# Patient Record
Sex: Female | Born: 1995 | Race: Black or African American | Hispanic: No | Marital: Single | Smoking: Never smoker
Health system: Southern US, Community
[De-identification: ages and names within clinical notes are randomized; demographics above are authoritative.]

## PROBLEM LIST (undated history)

## (undated) DIAGNOSIS — L732 Hidradenitis suppurativa: Secondary | ICD-10-CM

## (undated) HISTORY — PX: WISDOM TOOTH EXTRACTION: SHX21

## (undated) HISTORY — PX: OTHER SURGICAL HISTORY: SHX169

---
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
## 2019-02-19 DIAGNOSIS — L0591 Pilonidal cyst without abscess: Secondary | ICD-10-CM | POA: Insufficient documentation

## 2019-11-19 ENCOUNTER — Other Ambulatory Visit: Payer: Self-pay

## 2019-11-19 DIAGNOSIS — Z20822 Contact with and (suspected) exposure to covid-19: Secondary | ICD-10-CM

## 2019-11-20 LAB — NOVEL CORONAVIRUS, NAA: SARS-CoV-2, NAA: NOT DETECTED

## 2020-03-27 ENCOUNTER — Ambulatory Visit (INDEPENDENT_AMBULATORY_CARE_PROVIDER_SITE_OTHER): Payer: Medicaid Other

## 2020-03-27 ENCOUNTER — Ambulatory Visit (HOSPITAL_COMMUNITY)
Admission: EM | Admit: 2020-03-27 | Discharge: 2020-03-27 | Disposition: A | Discharge status: Home / Self Care | Payer: Medicaid Other | Attending: Family Medicine | Admitting: Family Medicine

## 2020-03-27 ENCOUNTER — Encounter (HOSPITAL_COMMUNITY): Payer: Self-pay

## 2020-03-27 ENCOUNTER — Other Ambulatory Visit: Payer: Self-pay

## 2020-03-27 DIAGNOSIS — M545 Low back pain, unspecified: Secondary | ICD-10-CM

## 2020-03-27 DIAGNOSIS — R52 Pain, unspecified: Secondary | ICD-10-CM | POA: Diagnosis not present

## 2020-03-27 DIAGNOSIS — M25551 Pain in right hip: Secondary | ICD-10-CM

## 2020-03-27 DIAGNOSIS — M542 Cervicalgia: Secondary | ICD-10-CM

## 2020-03-27 MED ORDER — TIZANIDINE HCL 4 MG PO TABS: 4.0000 mg | ORAL_TABLET | Freq: Four times a day (QID) | ORAL | 0 refills | Status: DC | PRN

## 2020-03-27 MED ORDER — NAPROXEN 500 MG PO TABS: 500.0000 mg | ORAL_TABLET | Freq: Two times a day (BID) | ORAL | 0 refills | Status: AC
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
## 2020-03-27 NOTE — Discharge Instructions (Signed)
Take the naproxen 2 x a day with food Take the tizanidine as needed This is helpful at night Activity as tolerated Ice or heat to area See sport medicine if pain persists
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
## 2020-04-11 ENCOUNTER — Other Ambulatory Visit: Payer: Self-pay

## 2020-04-11 ENCOUNTER — Ambulatory Visit (INDEPENDENT_AMBULATORY_CARE_PROVIDER_SITE_OTHER): Payer: Medicaid Other

## 2020-04-11 ENCOUNTER — Encounter (HOSPITAL_COMMUNITY): Payer: Self-pay

## 2020-04-11 ENCOUNTER — Ambulatory Visit (HOSPITAL_COMMUNITY)
Admission: EM | Admit: 2020-04-11 | Discharge: 2020-04-11 | Disposition: A | Discharge status: Home / Self Care | Payer: Medicaid Other | Attending: Emergency Medicine | Admitting: Emergency Medicine

## 2020-04-11 DIAGNOSIS — R1010 Upper abdominal pain, unspecified: Secondary | ICD-10-CM

## 2020-04-11 LAB — COMPREHENSIVE METABOLIC PANEL
ALT: 36 U/L (ref 0–44)
AST: 33 U/L (ref 15–41)
Albumin: 3.9 g/dL (ref 3.5–5.0)
Alkaline Phosphatase: 49 U/L (ref 38–126)
Anion gap: 9 (ref 5–15)
BUN: <5 mg/dL — ABNORMAL LOW (ref 6–20)
CO2: 26 mmol/L (ref 22–32)
Calcium: 9.5 mg/dL (ref 8.9–10.3)
Chloride: 103 mmol/L (ref 98–111)
Creatinine, Ser: 0.72 mg/dL (ref 0.44–1.00)
GFR calc Af Amer: >60 mL/min (ref >60)
GFR calc non Af Amer: >60 mL/min (ref >60)
Glucose, Bld: 84 mg/dL (ref 70–99)
Potassium: 3.5 mmol/L (ref 3.5–5.1)
Sodium: 138 mmol/L (ref 135–145)
Total Bilirubin: 0.5 mg/dL (ref 0.3–1.2)
Total Protein: 7.8 g/dL (ref 6.5–8.1)

## 2020-04-11 LAB — LIPASE, BLOOD: Lipase: 22 U/L (ref 11–51)

## 2020-04-11 LAB — CBC
HCT: 45.2 % (ref 36.0–46.0)
Hemoglobin: 14.5 g/dL (ref 12.0–15.0)
MCH: 27.9 pg (ref 26.0–34.0)
MCHC: 32.1 g/dL (ref 30.0–36.0)
MCV: 86.9 fL (ref 80.0–100.0)
Platelets: 372 10*3/uL (ref 150–400)
RBC: 5.2 MIL/uL — ABNORMAL HIGH (ref 3.87–5.11)
RDW: 13 % (ref 11.5–15.5)
WBC: 6 10*3/uL (ref 4.0–10.5)
nRBC: 0 % (ref 0.0–0.2)

## 2020-04-11 MED ORDER — LIDOCAINE VISCOUS HCL 2 % MT SOLN
15.0000 mL | Freq: Once | OROMUCOSAL | Status: AC
  Administered 2020-04-11: 15 mL via ORAL

## 2020-04-11 MED ORDER — ALUM & MAG HYDROXIDE-SIMETH 400-400-40 MG/5ML PO SUSP: 15.0000 mL | Freq: Four times a day (QID) | ORAL | 0 refills | Status: DC | PRN

## 2020-04-11 MED ORDER — LIDOCAINE VISCOUS HCL 2 % MT SOLN
OROMUCOSAL | Status: AC
  Filled 2020-04-11: qty 15

## 2020-04-11 MED ORDER — ONDANSETRON 4 MG PO TBDP: 4.0000 mg | ORAL_TABLET | Freq: Three times a day (TID) | ORAL | 0 refills | Status: DC | PRN

## 2020-04-11 MED ORDER — OMEPRAZOLE 20 MG PO CPDR: 20.0000 mg | DELAYED_RELEASE_CAPSULE | Freq: Two times a day (BID) | ORAL | 0 refills | Status: DC

## 2020-04-11 MED ORDER — ALUM & MAG HYDROXIDE-SIMETH 200-200-20 MG/5ML PO SUSP
ORAL | Status: AC
  Filled 2020-04-11: qty 30

## 2020-04-11 MED ORDER — ALUM & MAG HYDROXIDE-SIMETH 200-200-20 MG/5ML PO SUSP
30.0000 mL | Freq: Once | ORAL | Status: AC
  Administered 2020-04-11: 30 mL via ORAL

## 2020-04-11 NOTE — Discharge Instructions (Signed)
Begin omeprazole/prilosec twice daily for the next 2 weeks for acid prevention, be sure to take consistently May supplement with maalox or pepcid/famotidine as needed for symptoms as omeprazole begins to work Zofran as needed for nausea Avoid trigger foods- see attached I will call if blood work abnormal  If pain worsening or changing please follow up

## 2020-04-11 NOTE — ED Triage Notes (Signed)
Pt presents with RUQ and LUQ abdominal pain X 1 week with some nausea, vomiting, and mild diarrhea.

## 2020-04-11 NOTE — ED Provider Notes (Signed)
[facility]    CSN: [id number] Arrival date & time: 04/11/20  1303      History   Chief Complaint Chief Complaint  Patient presents with  . Abdominal Pain    HPI Claire Horton is a 24 y.o. female no significant past medical history presenting today for evaluation of abdominal pain.  Patient notes that she has had upper abdominal pain for approximately 1 week.  Initially related this to menstrual cramping that she was on her menstrual cycle earlier, but this is stopped and her pain has continued.  Initially she had looser stools and diarrhea which have resolved.  She took an over-the-counter antidiarrheal and since has not had any bowel movements.  She also reports decreased appetite and poor oral intake.  She has had this sensation as if she needs to have a bowel movement, but has not passed her bowels.  She has also developed increased nausea and vomiting over the past few days as well.  Denies fevers.  Describes pain as sharp.  Denies burning sensation.  Denies chest pain or shortness of breath.  Denies sore throat or sour taste in mouth.  Denies any pelvic symptoms of abnormal discharge or pelvic pain.  Denies any dysuria, increased frequency or urgency with urination.  Is on oral contraceptives.  Denies prior abdominal surgeries.  HPI  History reviewed. No pertinent past medical history.  There are no problems to display for this patient.   Past Surgical History:  Procedure Laterality Date  . Cyst removed from wrist Left   . WISDOM TOOTH EXTRACTION      OB History   No obstetric history on file.      Home Medications    Prior to Admission medications   Medication Sig Start Date End Date Taking? Authorizing Provider  ALAYCEN 1/35 tablet Take 1 tablet by mouth daily. 02/15/20   [provider]  alum & mag hydroxide-simeth (MAALOX PLUS) 400-400-40 MG/5ML suspension Take 15 mLs by mouth every 6 (six) hours as needed for indigestion. 04/11/20   Travon Crochet,  Adaley Kiene C, PA-C  naproxen (NAPROSYN) 500 MG tablet Take 1 tablet (500 mg total) by mouth 2 (two) times daily. 03/27/20   Eustace Moore, MD  omeprazole (PRILOSEC) 20 MG capsule Take 1 capsule (20 mg total) by mouth 2 (two) times daily before a meal for 14 days. 04/11/20 04/25/20  Kameran Mcneese C, PA-C  ondansetron (ZOFRAN ODT) 4 MG disintegrating tablet Take 1 tablet (4 mg total) by mouth every 8 (eight) hours as needed for nausea or vomiting. 04/11/20   Dallyn Bergland C, PA-C  tiZANidine (ZANAFLEX) 4 MG tablet Take 1-2 tablets (4-8 mg total) by mouth every 6 (six) hours as needed for muscle spasms. 03/27/20   Eustace Moore, MD    Family History Family History  Problem Relation Age of Onset  . Hypertension Mother     Social History Social History   Tobacco Use  . Smoking status: Never Smoker  . Smokeless tobacco: Never Used  Substance Use Topics  . Alcohol use: Yes    Alcohol/week: 3.0 standard drinks    Types: 3 Shots of liquor per week  . Drug use: Never     Allergies   Patient has no known allergies.   Review of Systems Review of Systems  Constitutional: Negative for fever.  Respiratory: Negative for shortness of breath.   Cardiovascular: Negative for chest pain.  Gastrointestinal: Positive for abdominal pain, constipation, nausea and vomiting. Negative for diarrhea.  Genitourinary: Negative for dysuria, flank pain, genital sores, hematuria, menstrual problem, vaginal bleeding, vaginal discharge and vaginal pain.  Musculoskeletal: Negative for back pain.  Skin: Negative for rash.  Neurological: Negative for dizziness, light-headedness and headaches.     Physical Exam Triage Vital Signs ED Triage Vitals  Enc Vitals Group     BP 04/11/20 1422 137/79     Pulse Rate 04/11/20 1422 81     Resp 04/11/20 1422 18     Temp 04/11/20 1422 99.2 F (37.3 C)     Temp Source 04/11/20 1422 Oral     SpO2 04/11/20 1422 100 %     Weight --      Height --      Head  Circumference --      Peak Flow --      Pain Score 04/11/20 1423 7     Pain Loc --      Pain Edu? --      Excl. in [place]? --    No data found.  Updated Vital Signs BP 137/79 (BP Location: Left Arm)   Pulse 81   Temp 99.2 F (37.3 C) (Oral)   Resp 18   LMP 04/06/2020   SpO2 100%   Visual Acuity Right Eye Distance:   Left Eye Distance:   Bilateral Distance:    Right Eye Near:   Left Eye Near:    Bilateral Near:     Physical Exam Vitals and nursing note reviewed.  Constitutional:      General: She is not in acute distress.    Appearance: She is well-developed.  HENT:     Head: Normocephalic and atraumatic.     Mouth/Throat:     Comments: Oral mucosa pink and moist, no tonsillar enlargement or exudate. Posterior pharynx patent and nonerythematous, no uvula deviation or swelling. Normal phonation. Eyes:     Conjunctiva/sclera: Conjunctivae normal.  Cardiovascular:     Rate and Rhythm: Normal rate and regular rhythm.     Heart sounds: No murmur.  Pulmonary:     Effort: Pulmonary effort is normal. No respiratory distress.     Breath sounds: Normal breath sounds.     Comments: Breathing comfortably at rest, CTABL, no wheezing, rales or other adventitious sounds auscultated  Abdominal:     Palpations: Abdomen is soft.     Tenderness: There is abdominal tenderness.     Comments: Soft, nondistended, tender to palpation throughout upper abdomen and epigastrium, nontender to bilateral lower quadrants or suprapubic area, negative Murphy's, negative McBurney's, negative Rovsing  Musculoskeletal:     Cervical back: Neck supple.  Skin:    General: Skin is warm and dry.  Neurological:     Mental Status: She is alert.      UC Treatments / Results  Labs (all labs ordered are listed, but only abnormal results are displayed) Labs Reviewed  CBC  COMPREHENSIVE METABOLIC PANEL  LIPASE, BLOOD    EKG   Radiology DG Abd 2 Views  Result Date: 04/11/2020 CLINICAL DATA:   Acute upper abdominal pain. EXAM: ABDOMEN - 2 VIEW COMPARISON:  None. FINDINGS: The bowel gas pattern is normal. Mild amount of stool is seen in the descending colon. There is no evidence of free air. No radio-opaque calculi or other significant radiographic abnormality is seen. IMPRESSION: No evidence of bowel obstruction or ileus.  Mild stool burden. Electronically Signed   By: Marijo Conception M.D.   On: 04/11/2020 14:47    Procedures Procedures (including critical  care time)  Medications Ordered in UC Medications  alum & mag hydroxide-simeth (MAALOX/MYLANTA) 200-200-20 MG/5ML suspension 30 mL (30 mLs Oral Given 04/11/20 1523)    And  lidocaine (XYLOCAINE) 2 % viscous mouth solution 15 mL (15 mLs Oral Given 04/11/20 1523)    Initial Impression / Assessment and Plan / UC Course  I have reviewed the triage vital signs and the nursing notes.  Pertinent labs & imaging results that were available during my care of the patient were reviewed by me and considered in my medical decision making (see chart for details).     Obtained abdominal x-ray as initially symptoms suggestive of constipation, with vomiting monitor rule out obstruction.  No obstruction and mild stool burden within the colon.  Not highly suggestive of constipation as cause of pain.  Providing GI cocktail to see if this provides any relief of discomfort.  Checking CBC, CMP and lipase as well as other screening given symptoms for greater than 1 week.  Do not suspect viral/infectious etiology at this time.  Significant improvement in discomfort with GI cocktail.  Will initiate on omeprazole twice daily x2 weeks.  Supplement with Maalox or Pepcid as needed.  Zofran for nausea.  Avoid trigger foods.  Discussed strict return precautions. Patient verbalized understanding and is agreeable with plan.    Final Clinical Impressions(s) / UC Diagnoses   Final diagnoses:  Pain of upper abdomen     Discharge Instructions     Begin  omeprazole/prilosec twice daily for the next 2 weeks for acid prevention, be sure to take consistently May supplement with maalox or pepcid/famotidine as needed for symptoms as omeprazole begins to work Zofran as needed for nausea Avoid trigger foods- see attached I will call if blood work abnormal  If pain worsening or changing please follow up     ED Prescriptions    Medication Sig Dispense Auth. Provider   omeprazole (PRILOSEC) 20 MG capsule Take 1 capsule (20 mg total) by mouth 2 (two) times daily before a meal for 14 days. 28 capsule Rico Massar C, PA-C   alum & mag hydroxide-simeth (MAALOX PLUS) 400-400-40 MG/5ML suspension Take 15 mLs by mouth every 6 (six) hours as needed for indigestion. 355 mL Sayid Moll C, PA-C   ondansetron (ZOFRAN ODT) 4 MG disintegrating tablet Take 1 tablet (4 mg total) by mouth every 8 (eight) hours as needed for nausea or vomiting. 20 tablet Alieah Brinton, [place] C, PA-C     PDMP not reviewed this encounter.   Lew Dawes, [place] 04/11/20 1549

## 2020-08-21 DIAGNOSIS — K5190 Ulcerative colitis, unspecified, without complications: Secondary | ICD-10-CM | POA: Insufficient documentation

## 2020-09-30 DIAGNOSIS — K5100 Ulcerative (chronic) pancolitis without complications: Secondary | ICD-10-CM | POA: Insufficient documentation

## 2021-01-02 ENCOUNTER — Other Ambulatory Visit: Payer: Self-pay

## 2021-01-02 ENCOUNTER — Ambulatory Visit
Admission: EM | Admit: 2021-01-02 | Discharge: 2021-01-02 | Disposition: A | Discharge status: Home / Self Care | Payer: Medicaid Other | Attending: Emergency Medicine | Admitting: Emergency Medicine

## 2021-01-02 ENCOUNTER — Encounter: Payer: Self-pay | Admitting: Emergency Medicine

## 2021-01-02 DIAGNOSIS — L02411 Cutaneous abscess of right axilla: Secondary | ICD-10-CM

## 2021-01-02 MED ORDER — IBUPROFEN 800 MG PO TABS: 800.0000 mg | ORAL_TABLET | Freq: Three times a day (TID) | ORAL | 0 refills | Status: DC

## 2021-01-02 NOTE — Discharge Instructions (Addendum)
Complete course of doxycycline  Pull packing out in 24-48 hours  Apply warm compresses/hot rags to area with massage to express further drainage especially the first 24-48 hours  Return if symptoms returning or not improving

## 2021-01-02 NOTE — ED Triage Notes (Signed)
Pt here for abscess to right axillary area x 10 days; pt sts currently taking doxycycline but still having pain and swelling

## 2021-01-02 NOTE — ED Provider Notes (Signed)
[facility]    CSN: [id number] Arrival date & time: 01/02/21  0807      History   Chief Complaint Chief Complaint  Patient presents with  . Abscess    HPI Claire Horton is a 25 y.o. female presenting today for evaluation of right axilla abscess.  Area has been present for approximately 10 days.  On doxycycline but still having pain and swelling.  Reports recurrent history of abscesses.  HPI  History reviewed. No pertinent past medical history.  There are no problems to display for this patient.   Past Surgical History:  Procedure Laterality Date  . Cyst removed from wrist Left   . WISDOM TOOTH EXTRACTION      OB History   No obstetric history on file.      Home Medications    Prior to Admission medications   Medication Sig Start Date End Date Taking? Authorizing Provider  ibuprofen (ADVIL) 800 MG tablet Take 1 tablet (800 mg total) by mouth 3 (three) times daily. 01/02/21  Yes Berklie Dethlefs, Junius Creamer, PA-C  ALAYCEN 1/35 tablet Take 1 tablet by mouth daily. 02/15/20   [provider]  alum & mag hydroxide-simeth (MAALOX PLUS) 400-400-40 MG/5ML suspension Take 15 mLs by mouth every 6 (six) hours as needed for indigestion. 04/11/20   Cregg Jutte C, PA-C  naproxen (NAPROSYN) 500 MG tablet Take 1 tablet (500 mg total) by mouth 2 (two) times daily. 03/27/20   Eustace Moore, MD  omeprazole (PRILOSEC) 20 MG capsule Take 1 capsule (20 mg total) by mouth 2 (two) times daily before a meal for 14 days. 04/11/20 04/25/20  Atlee Villers C, PA-C  ondansetron (ZOFRAN ODT) 4 MG disintegrating tablet Take 1 tablet (4 mg total) by mouth every 8 (eight) hours as needed for nausea or vomiting. Patient not taking: Reported on 01/02/2021 04/11/20   Alexios Keown C, PA-C  tiZANidine (ZANAFLEX) 4 MG tablet Take 1-2 tablets (4-8 mg total) by mouth every 6 (six) hours as needed for muscle spasms. Patient not taking: Reported on 01/02/2021 03/27/20   Eustace Moore, MD     Family History Family History  Problem Relation Age of Onset  . Hypertension Mother     Social History Social History   Tobacco Use  . Smoking status: Never Smoker  . Smokeless tobacco: Never Used  Vaping Use  . Vaping Use: Never used  Substance Use Topics  . Alcohol use: Yes    Alcohol/week: 3.0 standard drinks    Types: 3 Shots of liquor per week  . Drug use: Never     Allergies   Patient has no known allergies.   Review of Systems Review of Systems  Constitutional: Negative for fatigue and fever.  HENT: Negative for mouth sores.   Eyes: Negative for visual disturbance.  Respiratory: Negative for shortness of breath.   Cardiovascular: Negative for chest pain.  Gastrointestinal: Negative for abdominal pain, nausea and vomiting.  Musculoskeletal: Negative for arthralgias and joint swelling.  Skin: Positive for color change and rash. Negative for wound.  Neurological: Negative for dizziness, weakness, light-headedness and headaches.     Physical Exam Triage Vital Signs ED Triage Vitals  Enc Vitals Group     BP 01/02/21 0819 (!) 155/84     Pulse Rate 01/02/21 0819 85     Resp 01/02/21 0819 18     Temp 01/02/21 0819 98.8 F (37.1 C)     Temp Source 01/02/21 0819 Oral     SpO2  01/02/21 0819 99 %     Weight --      Height --      Head Circumference --      Peak Flow --      Pain Score 01/02/21 0822 7     Pain Loc --      Pain Edu? --      Excl. in [place]? --    No data found.  Updated Vital Signs BP (!) 155/84 (BP Location: Left Arm)   Pulse 85   Temp 98.8 F (37.1 C) (Oral)   Resp 18   SpO2 99%   Visual Acuity Right Eye Distance:   Left Eye Distance:   Bilateral Distance:    Right Eye Near:   Left Eye Near:    Bilateral Near:     Physical Exam Vitals and nursing note reviewed.  Constitutional:      Appearance: She is well-developed and well-nourished.     Comments: No acute distress  HENT:     Head: Normocephalic and atraumatic.      Nose: Nose normal.  Eyes:     Conjunctiva/sclera: Conjunctivae normal.  Cardiovascular:     Rate and Rhythm: Normal rate.  Pulmonary:     Effort: Pulmonary effort is normal. No respiratory distress.  Abdominal:     General: There is no distension.  Musculoskeletal:        General: Normal range of motion.     Cervical back: Neck supple.  Skin:    General: Skin is warm and dry.     Comments: Right axilla with large 3 to 4 cm abscess with central area of fluctuance  Neurological:     Mental Status: She is alert and oriented to person, place, and time.  Psychiatric:        Mood and Affect: Mood and affect normal.      UC Treatments / Results  Labs (all labs ordered are listed, but only abnormal results are displayed) Labs Reviewed - No data to display  EKG   Radiology No results found.  Procedures Incision and Drainage  Date/Time: 01/02/2021 9:14 AM Performed by: Kristy Catoe, [place] C, PA-C Authorized by: Dimitrius Steedman, Junius Creamer, PA-C   Consent:    Consent obtained:  Verbal   Consent given by:  Patient   Risks discussed:  Bleeding, incomplete drainage and pain   Alternatives discussed:  No treatment and alternative treatment Universal protocol:    Patient identity confirmed:  Verbally with patient Location:    Type:  Abscess   Size:  4 cm   Location: left axilla. Pre-procedure details:    Skin preparation:  Povidone-iodine Anesthesia:    Anesthesia method:  Local infiltration   Local anesthetic:  Lidocaine 2% w/o epi Procedure type:    Complexity:  Simple Procedure details:    Incision types:  Single straight   Incision depth:  Subcutaneous   Wound management:  Probed and deloculated   Drainage:  Bloody and purulent   Drainage amount:  Copious   Wound treatment:  Wound left open   Packing materials:  1/4 in iodoform gauze Post-procedure details:    Procedure completion:  Tolerated well, no immediate complications   (including critical care time)  Medications  Ordered in UC Medications - No data to display  Initial Impression / Assessment and Plan / UC Course  I have reviewed the triage vital signs and the nursing notes.  Pertinent labs & imaging results that were available during my care of the patient  were reviewed by me and considered in my medical decision making (see chart for details).     I&D performed today, packing placed, packing to be removed in 24 to 48 hours.  Complete course of doxycycline, anti-inflammatories for pain.  Follow-up for concerns about healing.  Discussed strict return precautions. Patient verbalized understanding and is agreeable with plan.  Final Clinical Impressions(s) / UC Diagnoses   Final diagnoses:  Abscess of right axilla     Discharge Instructions     Complete course of doxycycline  Pull packing out in 24-48 hours  Apply warm compresses/hot rags to area with massage to express further drainage especially the first 24-48 hours  Return if symptoms returning or not improving    ED Prescriptions    Medication Sig Dispense Auth. Provider   ibuprofen (ADVIL) 800 MG tablet Take 1 tablet (800 mg total) by mouth 3 (three) times daily. 21 tablet Jowell Bossi, [place] C, PA-C     PDMP not reviewed this encounter.   Lew Dawes, [place] 01/02/21 [phone]

## 2021-05-14 IMAGING — DX DG ABDOMEN 2V
3 series · 3 of 3 positions shown · non-contrast
Comparison: None.

CLINICAL DATA: Acute upper abdominal pain.

EXAM:
ABDOMEN - 2 VIEW

[abdomen erect]
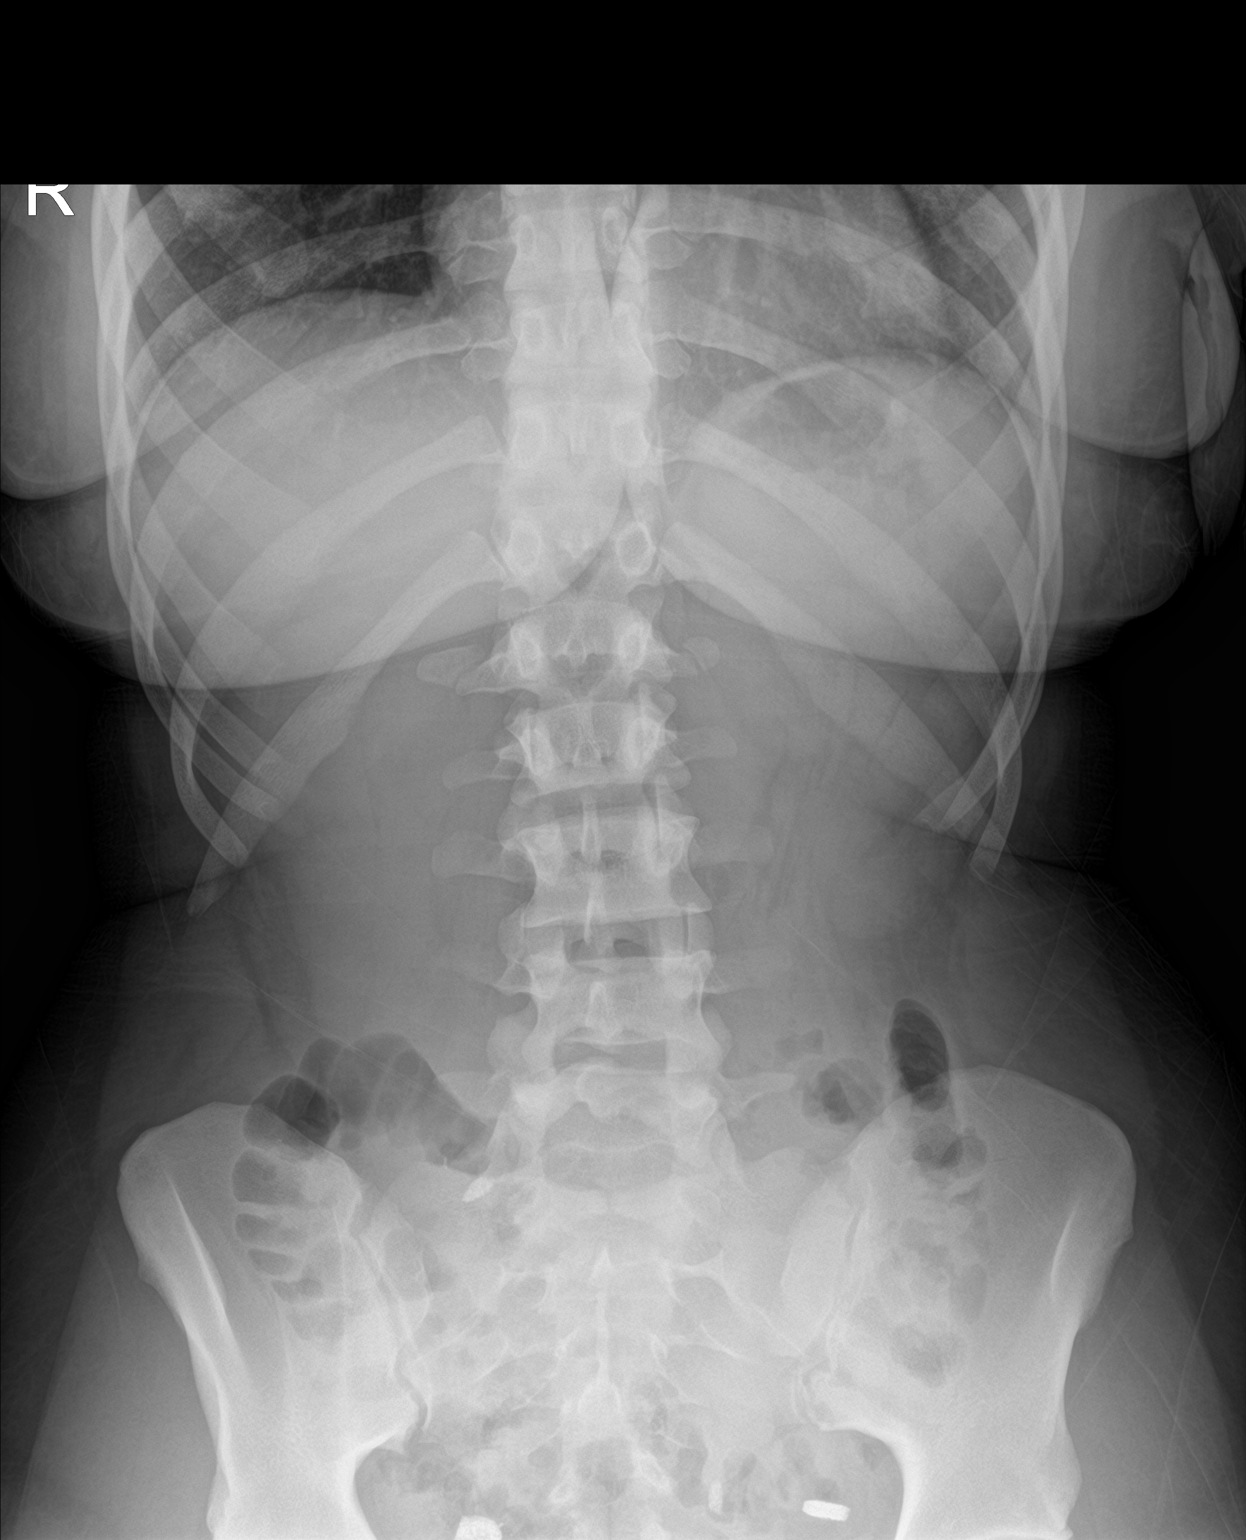

[abdomen supine (1 of 2)]
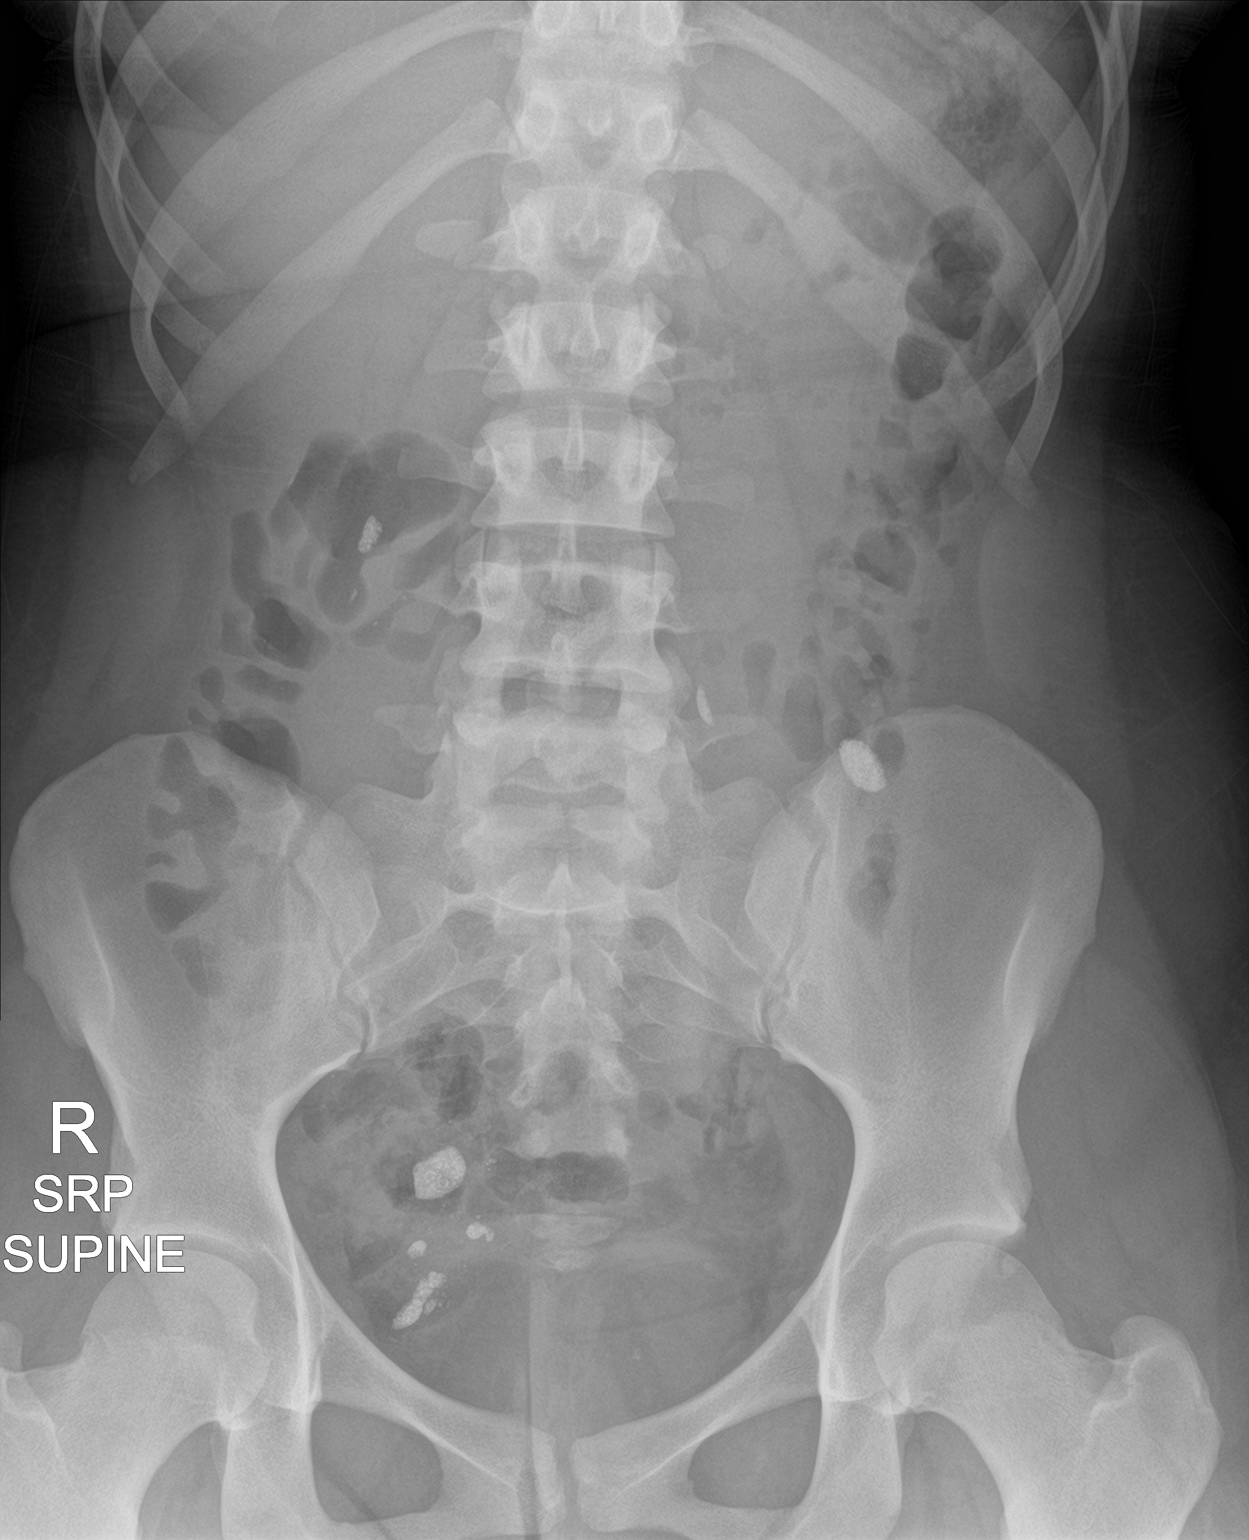

[abdomen supine (2 of 2)]
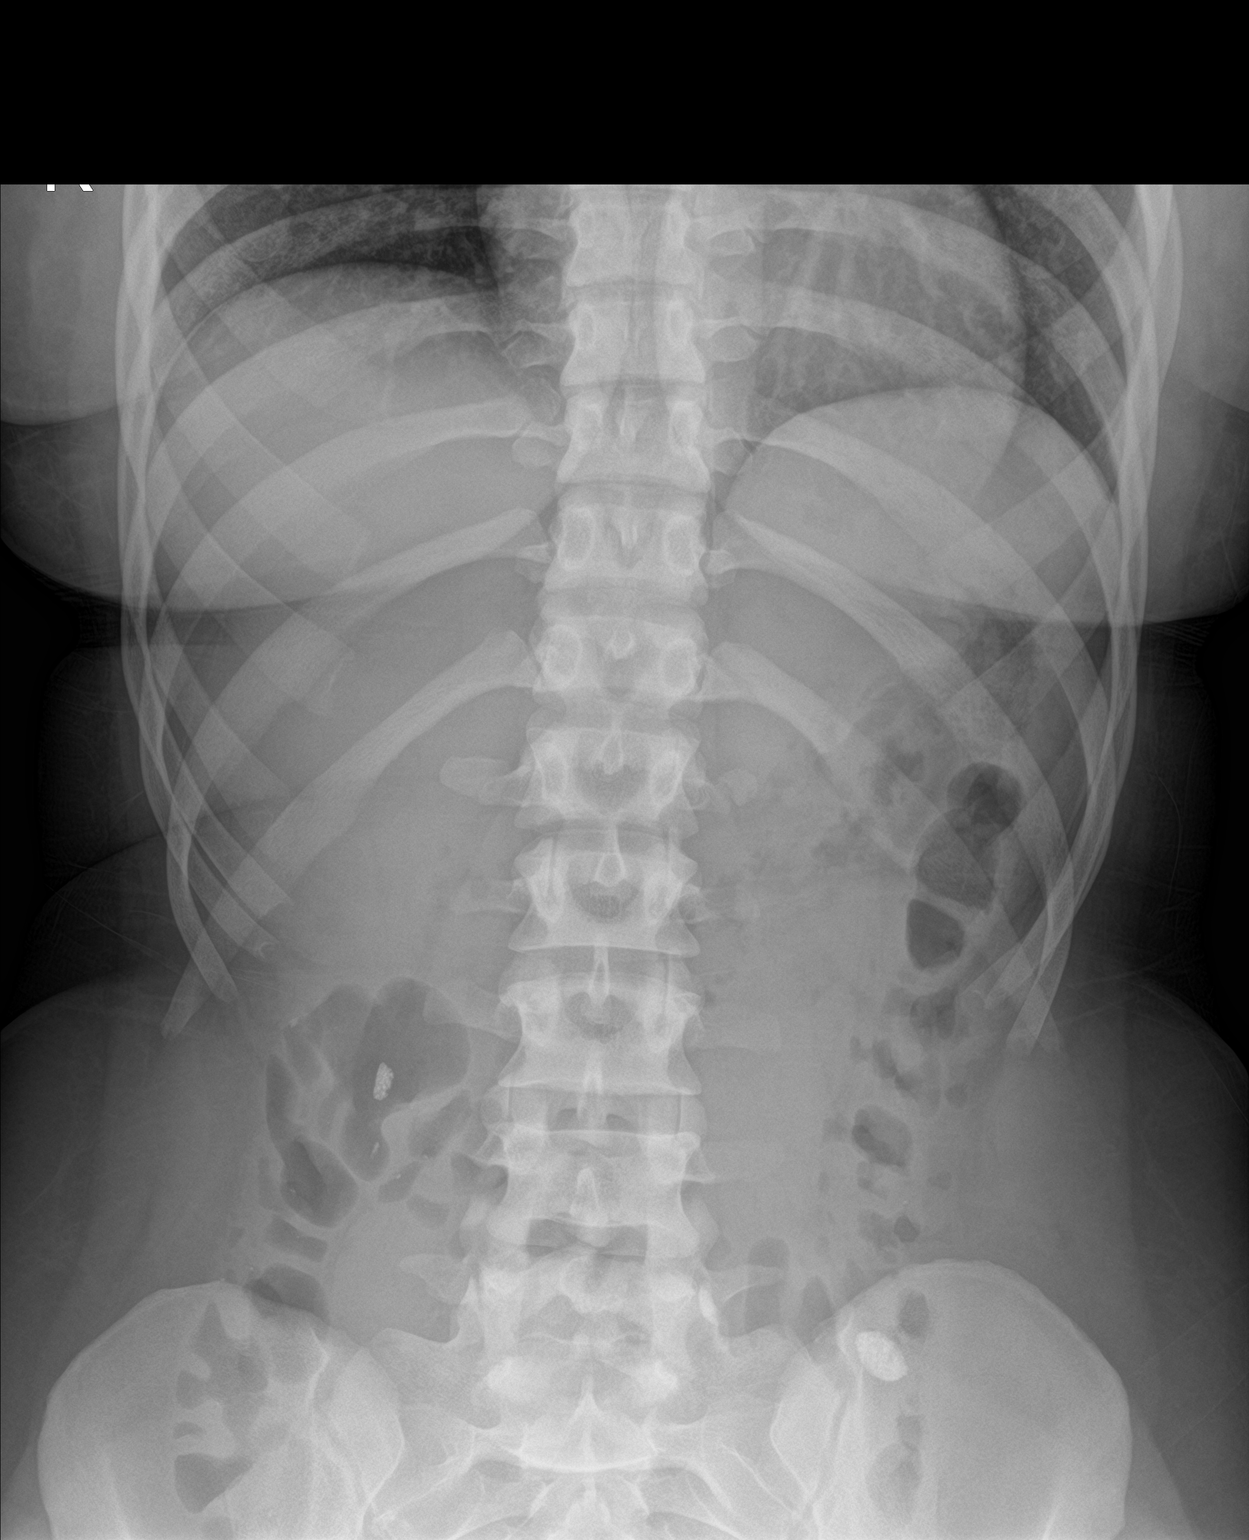

[3 of 3 positions shown; findings below may reference images not displayed]

FINDINGS: The bowel gas pattern is normal. Mild amount of stool is seen in the
descending colon. There is no evidence of free air. No radio-opaque
calculi or other significant radiographic abnormality is seen.
IMPRESSION: No evidence of bowel obstruction or ileus.  Mild stool burden.

## 2021-09-10 ENCOUNTER — Other Ambulatory Visit: Payer: Self-pay

## 2021-09-10 ENCOUNTER — Ambulatory Visit
Admission: EM | Admit: 2021-09-10 | Discharge: 2021-09-10 | Disposition: A | Discharge status: Home / Self Care | Payer: Medicaid Other | Attending: Urgent Care | Admitting: Urgent Care

## 2021-09-10 DIAGNOSIS — Z8719 Personal history of other diseases of the digestive system: Secondary | ICD-10-CM

## 2021-09-10 DIAGNOSIS — M79621 Pain in right upper arm: Secondary | ICD-10-CM

## 2021-09-10 MED ORDER — DOXYCYCLINE HYCLATE 100 MG PO CAPS: 100.0000 mg | ORAL_CAPSULE | Freq: Two times a day (BID) | ORAL | 0 refills | Status: DC

## 2021-09-10 MED ORDER — NAPROXEN 500 MG PO TABS: 500.0000 mg | ORAL_TABLET | Freq: Two times a day (BID) | ORAL | 0 refills | Status: AC

## 2021-09-10 NOTE — ED Provider Notes (Signed)
[facility]   MRN: [id number] DOB: 1996-01-04  Subjective:   Claire Horton is a 25 y.o. female presenting for 2-day history of acute onset recurrent bump over the right arm that is painful and swollen.  Patient states that she has a history of infections over this area.  Has had incision and drainage sometimes which does not always help.  Has also undergone courses of antibiotics which also does not always help.  Denies history of hidradenitis but has not seen dermatologist.  She does have a history of ulcerative colitis and is on immunosuppression for this with Remicade.  Patient would also like to have her left ear evaluated for infection.  Has had some pain to the area.  No current facility-administered medications for this encounter.  Current Outpatient Medications:    ALAYCEN 1/35 tablet, Take 1 tablet by mouth daily., Disp: , Rfl:    alum & mag hydroxide-simeth (MAALOX PLUS) 400-400-40 MG/5ML suspension, Take 15 mLs by mouth every 6 (six) hours as needed for indigestion., Disp: 355 mL, Rfl: 0   ibuprofen (ADVIL) 800 MG tablet, Take 1 tablet (800 mg total) by mouth 3 (three) times daily., Disp: 21 tablet, Rfl: 0   naproxen (NAPROSYN) 500 MG tablet, Take 1 tablet (500 mg total) by mouth 2 (two) times daily., Disp: 30 tablet, Rfl: 0   omeprazole (PRILOSEC) 20 MG capsule, Take 1 capsule (20 mg total) by mouth 2 (two) times daily before a meal for 14 days., Disp: 28 capsule, Rfl: 0   ondansetron (ZOFRAN ODT) 4 MG disintegrating tablet, Take 1 tablet (4 mg total) by mouth every 8 (eight) hours as needed for nausea or vomiting. (Patient not taking: Reported on 01/02/2021), Disp: 20 tablet, Rfl: 0   tiZANidine (ZANAFLEX) 4 MG tablet, Take 1-2 tablets (4-8 mg total) by mouth every 6 (six) hours as needed for muscle spasms. (Patient not taking: Reported on 01/02/2021), Disp: 21 tablet, Rfl: 0   No Known Allergies  History reviewed. No pertinent past medical history.   Past Surgical  History:  Procedure Laterality Date   Cyst removed from wrist Left    WISDOM TOOTH EXTRACTION      Family History  Problem Relation Age of Onset   Hypertension Mother     Social History   Tobacco Use   Smoking status: Never   Smokeless tobacco: Never  Vaping Use   Vaping Use: Never used  Substance Use Topics   Alcohol use: Yes    Alcohol/week: 3.0 standard drinks    Types: 3 Shots of liquor per week   Drug use: Never    ROS   Objective:   Vitals: BP 120/80 (BP Location: Left Arm)   Pulse 78   Temp 98.2 F (36.8 C) (Oral)   Resp 20   LMP 08/27/2021 (Approximate)   SpO2 99%   Physical Exam Constitutional:      General: She is not in acute distress.    Appearance: Normal appearance. She is well-developed. She is not ill-appearing, toxic-appearing or diaphoretic.  HENT:     Head: Normocephalic and atraumatic.     Left Ear: Tympanic membrane, ear canal and external ear normal. There is no impacted cerumen.     Nose: Nose normal.     Mouth/Throat:     Mouth: Mucous membranes are moist.     Pharynx: Oropharynx is clear.  Eyes:     General: No scleral icterus.    Extraocular Movements: Extraocular movements intact.     Pupils:  Pupils are equal, round, and reactive to light.  Cardiovascular:     Rate and Rhythm: Normal rate.  Pulmonary:     Effort: Pulmonary effort is normal.  Skin:    General: Skin is warm and dry.       Neurological:     General: No focal deficit present.     Mental Status: She is alert and oriented to person, place, and time.  Psychiatric:        Mood and Affect: Mood normal.        Behavior: Behavior normal.        Thought Content: Thought content normal.        Judgment: Judgment normal.    Assessment and Plan :   PDMP not reviewed this encounter.  1. Pain in right axilla   2. History of ulcerative colitis     Area of the right axilla not amenable to incision and drainage.  Offered this to the patient but we both agreed to  hold off and try doxycycline, use naproxen for pain and inflammation.  Recommended consult for suspected hidradenitis with dermatology.  I attempted a referral but also informed patient that they may require referral from her PCP.  Use supportive care for left ear pain.  No signs of infectious process.  Follow-up with PCP. Counseled patient on potential for adverse effects with medications prescribed/recommended today, ER and return-to-clinic precautions discussed, patient verbalized understanding.    Wallis Bamberg, [place] 09/10/21 [phone]

## 2021-09-10 NOTE — ED Triage Notes (Signed)
Pt c/o abscess under right arm onset approx 2 days ago. Pt states this has happened before and required draining packing and abx.   Also c/o left ear pain onset 2 days ago denies drainage.

## 2021-09-12 ENCOUNTER — Encounter: Payer: Self-pay | Admitting: Emergency Medicine

## 2021-09-12 ENCOUNTER — Other Ambulatory Visit: Payer: Self-pay

## 2021-09-12 ENCOUNTER — Ambulatory Visit
Admission: EM | Admit: 2021-09-12 | Discharge: 2021-09-12 | Disposition: A | Discharge status: Home / Self Care | Payer: Medicaid Other

## 2021-09-12 DIAGNOSIS — L02419 Cutaneous abscess of limb, unspecified: Secondary | ICD-10-CM

## 2021-09-12 NOTE — Discharge Instructions (Signed)
Please change your dressing 3-5 times daily. Do not apply any ointments or creams. Each time you change your dressing, make sure that you are pressing on the wound to get pus to come out.  Try your best to have a family member help you clean gently around the perimeter of the wound with gentle soap and warm water. Pat your wound dry and let it air out if possible to make sure it is dry before reapplying another dressing.   

## 2021-09-12 NOTE — ED Triage Notes (Signed)
Pt here for abscess to right axillary area; seen here Friday started on antibiotics but no improvement per pt

## 2021-09-12 NOTE — ED Provider Notes (Signed)
[facility]   MRN: [id number] DOB: May 26, 1996  Subjective:   Claire Horton is a 25 y.o. female presenting for recheck on right axillary pain with swelling.  At her last visit, wound was not amenable to I&D.  Today, she woke up with significantly more swelling and pain.  Has talked with her PCP about possibility of referral to dermatology for consideration of hidradenitis as her diagnosis.  Referral is pending otherwise.  No current facility-administered medications for this encounter.  Current Outpatient Medications:    ALAYCEN 1/35 tablet, Take 1 tablet by mouth daily., Disp: , Rfl:    alum & mag hydroxide-simeth (MAALOX PLUS) 400-400-40 MG/5ML suspension, Take 15 mLs by mouth every 6 (six) hours as needed for indigestion., Disp: 355 mL, Rfl: 0   doxycycline (VIBRAMYCIN) 100 MG capsule, Take 1 capsule (100 mg total) by mouth 2 (two) times daily., Disp: 20 capsule, Rfl: 0   ibuprofen (ADVIL) 800 MG tablet, Take 1 tablet (800 mg total) by mouth 3 (three) times daily., Disp: 21 tablet, Rfl: 0   naproxen (NAPROSYN) 500 MG tablet, Take 1 tablet (500 mg total) by mouth 2 (two) times daily., Disp: 30 tablet, Rfl: 0   naproxen (NAPROSYN) 500 MG tablet, Take 1 tablet (500 mg total) by mouth 2 (two) times daily with a meal., Disp: 30 tablet, Rfl: 0   omeprazole (PRILOSEC) 20 MG capsule, Take 1 capsule (20 mg total) by mouth 2 (two) times daily before a meal for 14 days., Disp: 28 capsule, Rfl: 0   ondansetron (ZOFRAN ODT) 4 MG disintegrating tablet, Take 1 tablet (4 mg total) by mouth every 8 (eight) hours as needed for nausea or vomiting. (Patient not taking: Reported on 01/02/2021), Disp: 20 tablet, Rfl: 0   tiZANidine (ZANAFLEX) 4 MG tablet, Take 1-2 tablets (4-8 mg total) by mouth every 6 (six) hours as needed for muscle spasms. (Patient not taking: Reported on 01/02/2021), Disp: 21 tablet, Rfl: 0   No Known Allergies  History reviewed. No pertinent past medical history.   Past  Surgical History:  Procedure Laterality Date   Cyst removed from wrist Left    WISDOM TOOTH EXTRACTION      Family History  Problem Relation Age of Onset   Hypertension Mother     Social History   Tobacco Use   Smoking status: Never   Smokeless tobacco: Never  Vaping Use   Vaping Use: Never used  Substance Use Topics   Alcohol use: Yes    Alcohol/week: 3.0 standard drinks    Types: 3 Shots of liquor per week   Drug use: Never    ROS   Objective:   Vitals: BP 122/85 (BP Location: Right Arm)   Pulse 82   Temp 98.8 F (37.1 C) (Oral)   Resp 18   LMP 08/27/2021 (Approximate)   SpO2 98%   Physical Exam Constitutional:      General: She is not in acute distress.    Appearance: Normal appearance. She is well-developed. She is not ill-appearing.  HENT:     Head: Normocephalic and atraumatic.     Nose: Nose normal.     Mouth/Throat:     Mouth: Mucous membranes are moist.     Pharynx: Oropharynx is clear.  Eyes:     General: No scleral icterus.    Extraocular Movements: Extraocular movements intact.     Pupils: Pupils are equal, round, and reactive to light.  Cardiovascular:     Rate and Rhythm: Normal rate.  Pulmonary:     Effort: Pulmonary effort is normal.  Skin:    General: Skin is warm and dry.       Neurological:     General: No focal deficit present.     Mental Status: She is alert and oriented to person, place, and time.  Psychiatric:        Mood and Affect: Mood normal.        Behavior: Behavior normal.    PROCEDURE NOTE: I&D of Abscess Verbal consent obtained. Local anesthesia with 3cc of 2% lidocaine with epinephrine. Site cleansed with Betadine. Incision of 1cm was made using an 11 blade, 4cc expressed consisting of a mixture of pus and serosanguinous fluid. Wound cavity was explored with curved hemostats and loculations loosened. Cleansed and dressed.   Assessment and Plan :   PDMP not reviewed this encounter.  1. Axillary abscess      Offered patient to switch her antibiotic but she prefers to continue with it.  Counseled on wound care.  Encouraged patient to continue with referrals to dermatology and recheck with PCP.  No packing necessary. Counseled patient on potential for adverse effects with medications prescribed/recommended today, ER and return-to-clinic precautions discussed, patient verbalized understanding.    Wallis Bamberg, [place] 09/12/21 [phone]

## 2022-05-11 ENCOUNTER — Encounter: Payer: Self-pay | Admitting: Emergency Medicine

## 2022-05-11 ENCOUNTER — Ambulatory Visit
Admission: EM | Admit: 2022-05-11 | Discharge: 2022-05-11 | Disposition: A | Discharge status: Home / Self Care | Payer: Medicaid Other | Attending: Urgent Care | Admitting: Urgent Care

## 2022-05-11 DIAGNOSIS — L03115 Cellulitis of right lower limb: Secondary | ICD-10-CM | POA: Diagnosis not present

## 2022-05-11 MED ORDER — CEPHALEXIN 500 MG PO CAPS: 500.0000 mg | ORAL_CAPSULE | Freq: Four times a day (QID) | ORAL | 0 refills | Status: AC

## 2022-05-11 MED ORDER — MUPIROCIN 2 % EX OINT: 1.0000 "application " | TOPICAL_OINTMENT | Freq: Two times a day (BID) | CUTANEOUS | 0 refills | Status: DC

## 2022-05-11 NOTE — ED Triage Notes (Signed)
Pt is present today with a boil on her inner right thigh. Pt state that she notice uit four days ago  ?

## 2022-05-11 NOTE — ED Provider Notes (Signed)
[facility]    CSN: [id number] Arrival date & time: 05/11/22  1541      History   Chief Complaint Chief Complaint  Patient presents with   Recurrent Skin Infections    Boil     HPI Claire Horton is a 26 y.o. female.   26yo female presents today with a 4 day issue with her R inner thigh. 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 Was prescribed spironolactone, but states she did not pick it up. Pt reports when this lesion started, it started as a dark spot to her medial R thigh. Painful with her shorts rubbing on the skin. She denies a tick bite, ingrown hair or insect bit. She has not tried any treatments. Admits this is much lower than her typical HS boils which are usually in the intertriginous region.    History reviewed. No pertinent past medical history.  There are no problems to display for this patient.   Past Surgical History:  Procedure Laterality Date   Cyst removed from wrist Left    WISDOM TOOTH EXTRACTION      OB History   No obstetric history on file.      Home Medications    Prior to Admission medications   Medication Sig Start Date End Date Taking? Authorizing Provider  cephALEXin (KEFLEX) 500 MG capsule Take 1 capsule (500 mg total) by mouth 4 (four) times daily for 5 days. 05/11/22 05/16/22 Yes Mahi Zabriskie L, PA  mupirocin ointment (BACTROBAN) 2 % Apply 1 application. topically 2 (two) times daily. 05/11/22  Yes Maretta Bees, PA  ALAYCEN 1/35 tablet Take 1 tablet by mouth daily. 02/15/20   [provider]  naproxen (NAPROSYN) 500 MG tablet Take 1 tablet (500 mg total) by mouth 2 (two) times daily. 03/27/20   Eustace Moore, MD  naproxen (NAPROSYN) 500 MG tablet Take 1 tablet (500 mg total) by mouth 2 (two) times daily with a meal. 09/10/21   Wallis Bamberg, PA-C    Family History Family History  Problem Relation Age of Onset   Hypertension Mother     Social History Social History   Tobacco Use   Smoking  status: Never   Smokeless tobacco: Never  Vaping Use   Vaping Use: Never used  Substance Use Topics   Alcohol use: Yes    Alcohol/week: 3.0 standard drinks    Types: 3 Shots of liquor per week   Drug use: Never     Allergies   Patient has no known allergies.   Review of Systems Review of Systems  Skin:        Skin lesion  All other systems reviewed and are negative.   Physical Exam Triage Vital Signs ED Triage Vitals [05/11/22 1558]  Enc Vitals Group     BP 133/76     Pulse Rate 85     Resp 17     Temp 98.4 F (36.9 C)     Temp src      SpO2 98 %     Weight      Height      Head Circumference      Peak Flow      Pain Score 8     Pain Loc      Pain Edu?      Excl. in GC?    No data found.  Updated Vital Signs BP 133/76   Pulse 85   Temp 98.4 F (36.9 C)  Resp 17   SpO2 98%   Visual Acuity Right Eye Distance:   Left Eye Distance:   Bilateral Distance:    Right Eye Near:   Left Eye Near:    Bilateral Near:     Physical Exam Vitals and nursing note reviewed.  Constitutional:      General: She is not in acute distress.    Appearance: Normal appearance. She is obese. She is not ill-appearing, toxic-appearing or diaphoretic.  HENT:     Head: Normocephalic.  Cardiovascular:     Rate and Rhythm: Normal rate.  Pulmonary:     Effort: Pulmonary effort is normal. No respiratory distress.  Musculoskeletal:        General: No swelling or tenderness.  Skin:    General: Skin is warm and dry.     Coloration: Skin is not jaundiced.     Findings: Erythema (roughly 3" patch of warmth and erythema to the medial aspect of R thigh with central induration. NO fluctuance or drainage. SKin intact) present. No bruising.  Neurological:     Mental Status: She is alert.     UC Treatments / Results  Labs (all labs ordered are listed, but only abnormal results are displayed) Labs Reviewed - No data to display  EKG   Radiology No results  found.  Procedures Procedures (including critical care time)  Medications Ordered in UC Medications - No data to display  Initial Impression / Assessment and Plan / UC Course  I have reviewed the triage vital signs and the nursing notes.  Pertinent labs & imaging results that were available during my care of the patient were reviewed by me and considered in my medical decision making (see chart for details).     Cellulitis - this does not look like a HS boil. Developing cellulitis with induration to central area. No drainage. Recommended warm compresses, topical and PO antibiotics. Follow up with dermatology, encouraged compliance with aldactone for HS.   Final Clinical Impressions(s) / UC Diagnoses   Final diagnoses:  Cellulitis of right lower extremity     Discharge Instructions      Your skin lesion appears to be a cellulitis to your R inner thigh. Please apply moist heat several times daily to your skin. You may also use a epsom salt bath to soak in. After each compress or bath, apply topical mupirocin ointment three times daily Start keflex four times daily until gone. Start taking spironolactone as prescribed from dermatology for HS prevention.     ED Prescriptions     Medication Sig Dispense Auth. Provider   cephALEXin (KEFLEX) 500 MG capsule Take 1 capsule (500 mg total) by mouth 4 (four) times daily for 5 days. 20 capsule Kelia Gibbon L, PA   mupirocin ointment (BACTROBAN) 2 % Apply 1 application. topically 2 (two) times daily. 22 g Jackelyne Sayer L, [place]      PDMP not reviewed this encounter.   Maretta Bees, [place] 05/13/22 2207

## 2022-05-11 NOTE — Discharge Instructions (Signed)
Your skin lesion appears to be a cellulitis to your R inner thigh. ?Please apply moist heat several times daily to your skin. ?You may also use a epsom salt bath to soak in. ?After each compress or bath, apply topical mupirocin ointment three times daily ?Start keflex four times daily until gone. ?Start taking spironolactone as prescribed from dermatology for HS prevention. ? ?

## 2022-07-12 ENCOUNTER — Ambulatory Visit
Admission: EM | Admit: 2022-07-12 | Discharge: 2022-07-12 | Disposition: A | Discharge status: Home / Self Care | Payer: Medicaid Other | Attending: Physician Assistant | Admitting: Physician Assistant

## 2022-07-12 ENCOUNTER — Encounter: Payer: Self-pay | Admitting: Emergency Medicine

## 2022-07-12 ENCOUNTER — Other Ambulatory Visit: Payer: Self-pay

## 2022-07-12 DIAGNOSIS — L0231 Cutaneous abscess of buttock: Secondary | ICD-10-CM

## 2022-07-12 MED ORDER — DOXYCYCLINE HYCLATE 100 MG PO CAPS: 100.0000 mg | ORAL_CAPSULE | Freq: Two times a day (BID) | ORAL | 0 refills | Status: DC

## 2022-07-12 NOTE — ED Triage Notes (Signed)
Pt here for abscess to right buttocks x 1 week with hx of same

## 2022-07-12 NOTE — Discharge Instructions (Addendum)
Return if any problems.

## 2022-07-20 DIAGNOSIS — L0231 Cutaneous abscess of buttock: Secondary | ICD-10-CM | POA: Diagnosis not present

## 2022-07-20 NOTE — ED Provider Notes (Signed)
[facility]    CSN: [id number] Arrival date & time: 07/12/22  5093      History   Chief Complaint Chief Complaint  Patient presents with   Abscess    HPI Claire Horton is a 26 y.o. female.   Patient complains of an abscess to her right buttock for the past week patient reports area is becoming more painful and swollen.  Patient is hoping she can have the area drained  The history is provided by the patient. No language interpreter was used.  Abscess Abscess quality: painful, redness and warmth   Progression:  Worsening Pain details:    Severity:  Moderate   Duration:  2 days   Timing:  Constant Chronicity:  New Worsened by:  Nothing Ineffective treatments:  None tried   History reviewed. No pertinent past medical history.  There are no problems to display for this patient.   Past Surgical History:  Procedure Laterality Date   Cyst removed from wrist Left    WISDOM TOOTH EXTRACTION      OB History   No obstetric history on file.      Home Medications    Prior to Admission medications   Medication Sig Start Date End Date Taking? Authorizing Provider  doxycycline (VIBRAMYCIN) 100 MG capsule Take 1 capsule (100 mg total) by mouth 2 (two) times daily. 07/12/22  Yes Osie Cheeks  [facility] 1/35 tablet Take 1 tablet by mouth daily. 02/15/20   [provider]  mupirocin ointment (BACTROBAN) 2 % Apply 1 application. topically 2 (two) times daily. 05/11/22   Crain, Whitney L, PA  naproxen (NAPROSYN) 500 MG tablet Take 1 tablet (500 mg total) by mouth 2 (two) times daily. 03/27/20   Eustace Moore, MD  naproxen (NAPROSYN) 500 MG tablet Take 1 tablet (500 mg total) by mouth 2 (two) times daily with a meal. 09/10/21   Wallis Bamberg, PA-C    Family History Family History  Problem Relation Age of Onset   Hypertension Mother     Social History Social History   Tobacco Use   Smoking status: Never   Smokeless tobacco: Never  Vaping  Use   Vaping Use: Never used  Substance Use Topics   Alcohol use: Yes    Alcohol/week: 3.0 standard drinks of alcohol    Types: 3 Shots of liquor per week   Drug use: Never     Allergies   Patient has no known allergies.   Review of Systems Review of Systems  All other systems reviewed and are negative.    Physical Exam Triage Vital Signs ED Triage Vitals  Enc Vitals Group     BP 07/12/22 0912 140/81     Pulse Rate 07/12/22 0912 92     Resp 07/12/22 0912 18     Temp 07/12/22 0912 99 F (37.2 C)     Temp Source 07/12/22 0912 Oral     SpO2 07/12/22 0912 98 %     Weight --      Height --      Head Circumference --      Peak Flow --      Pain Score 07/12/22 0913 8     Pain Loc --      Pain Edu? --      Excl. in GC? --    No data found.  Updated Vital Signs BP 140/81 (BP Location: Left Arm)   Pulse 92   Temp 99 F (37.2 C) (Oral)  Resp 18   SpO2 98%   Visual Acuity Right Eye Distance:   Left Eye Distance:   Bilateral Distance:    Right Eye Near:   Left Eye Near:    Bilateral Near:     Physical Exam Vitals and nursing note reviewed.  Constitutional:      Appearance: She is well-developed.  HENT:     Head: Normocephalic.  Cardiovascular:     Rate and Rhythm: Normal rate.  Pulmonary:     Effort: Pulmonary effort is normal.  Abdominal:     General: There is no distension.  Musculoskeletal:        General: Normal range of motion.     Cervical back: Normal range of motion.  Skin:    General: Skin is warm.     Comments: 4 cm swollen area right buttock  Neurological:     General: No focal deficit present.     Mental Status: She is alert and oriented to person, place, and time.      UC Treatments / Results  Labs (all labs ordered are listed, but only abnormal results are displayed) Labs Reviewed - No data to display  EKG   Radiology No results found.  Procedures Incision and Drainage  Date/Time: 07/20/2022 4:16 PM  Performed by:  Elson Areas, PA-C Authorized by: Elson Areas, PA-C   Consent:    Consent obtained:  Verbal   Consent given by:  Patient   Risks, benefits, and alternatives were discussed: yes     Risks discussed:  Bleeding Universal protocol:    Procedure explained and questions answered to patient or proxy's satisfaction: yes     Immediately prior to procedure, a time out was called: yes     Patient identity confirmed:  Verbally with patient Location:    Type:  Abscess   Size:  For   Location:  Lower extremity   Lower extremity location:  Buttock   Buttock location:  R buttock Pre-procedure details:    Skin preparation:  Antiseptic wash Anesthesia:    Anesthesia method:  Local infiltration   Local anesthetic:  Lidocaine 1% w/o epi Procedure type:    Complexity:  Complex Procedure details:    Incision types:  Single straight   Wound management:  Probed and deloculated and irrigated with saline   Drainage amount:  Moderate   Wound treatment:  Wound left open  (including critical care time)  Medications Ordered in UC Medications - No data to display  Initial Impression / Assessment and Plan / UC Course  I have reviewed the triage vital signs and the nursing notes.  Pertinent labs & imaging results that were available during my care of the patient were reviewed by me and considered in my medical decision making (see chart for details).      Final Clinical Impressions(s) / UC Diagnoses   Final diagnoses:  None     Discharge Instructions      Return if any problems.   ED Prescriptions     Medication Sig Dispense Auth. Provider   doxycycline (VIBRAMYCIN) 100 MG capsule Take 1 capsule (100 mg total) by mouth 2 (two) times daily. 20 capsule Elson Areas, [place]      PDMP not reviewed this encounter.   Elson Areas, [place] 07/20/22 [id number]

## 2022-09-11 ENCOUNTER — Ambulatory Visit
Admission: EM | Admit: 2022-09-11 | Discharge: 2022-09-11 | Disposition: A | Discharge status: Home / Self Care | Payer: Medicaid Other | Attending: Family Medicine | Admitting: Family Medicine

## 2022-09-11 ENCOUNTER — Other Ambulatory Visit: Payer: Self-pay

## 2022-09-11 ENCOUNTER — Encounter: Payer: Self-pay | Admitting: Emergency Medicine

## 2022-09-11 DIAGNOSIS — L0231 Cutaneous abscess of buttock: Secondary | ICD-10-CM | POA: Diagnosis not present

## 2022-09-11 HISTORY — DX: Hidradenitis suppurativa: L73.2

## 2022-09-11 MED ORDER — DOXYCYCLINE HYCLATE 100 MG PO CAPS: 100.0000 mg | ORAL_CAPSULE | Freq: Two times a day (BID) | ORAL | 0 refills | Status: DC

## 2022-09-11 NOTE — ED Triage Notes (Signed)
Pt here for abscess to top of right buttocks x 1 week; pt sts pain and swelling with hx of HS

## 2022-09-11 NOTE — ED Provider Notes (Signed)
[facility]    CSN: [id number] Arrival date & time: 09/11/22  0816      History   Chief Complaint Chief Complaint  Patient presents with   Abscess    HPI Mattalynn Crandle is a 26 y.o. female.   Presenting today with concerns of a gluteal abscess for the past week.  States area has been getting larger, more painful over the course of the week.  Denies any drainage, bleeding, fevers, chills, body aches.  History of hidradenitis and has had the same area flareup twice in the past and has had to get the area drained.  Trying warm compresses and topical remedies with no relief.    Past Medical History:  Diagnosis Date   Hidradenitis suppurativa     There are no problems to display for this patient.   Past Surgical History:  Procedure Laterality Date   Cyst removed from wrist Left    WISDOM TOOTH EXTRACTION      OB History   No obstetric history on file.      Home Medications    Prior to Admission medications   Medication Sig Start Date End Date Taking? Authorizing Provider  ALAYCEN 1/35 tablet Take 1 tablet by mouth daily. 02/15/20   [provider]  doxycycline (VIBRAMYCIN) 100 MG capsule Take 1 capsule (100 mg total) by mouth 2 (two) times daily. 09/11/22   Volney American, PA-C  mupirocin ointment (BACTROBAN) 2 % Apply 1 application. topically 2 (two) times daily. 05/11/22   Crain, Whitney L, PA  naproxen (NAPROSYN) 500 MG tablet Take 1 tablet (500 mg total) by mouth 2 (two) times daily. 03/27/20   Raylene Everts, MD  naproxen (NAPROSYN) 500 MG tablet Take 1 tablet (500 mg total) by mouth 2 (two) times daily with a meal. 09/10/21   Jaynee Eagles, PA-C    Family History Family History  Problem Relation Age of Onset   Hypertension Mother     Social History Social History   Tobacco Use   Smoking status: Never   Smokeless tobacco: Never  Vaping Use   Vaping Use: Never used  Substance Use Topics   Alcohol use: Yes     Alcohol/week: 3.0 standard drinks of alcohol    Types: 3 Shots of liquor per week   Drug use: Never     Allergies   Patient has no known allergies.   Review of Systems Review of Systems Per HPI  Physical Exam Triage Vital Signs ED Triage Vitals  Enc Vitals Group     BP 09/11/22 0828 137/87     Pulse Rate 09/11/22 0828 96     Resp 09/11/22 0828 18     Temp 09/11/22 0828 98.4 F (36.9 C)     Temp Source 09/11/22 0828 Oral     SpO2 09/11/22 0828 98 %     Weight --      Height --      Head Circumference --      Peak Flow --      Pain Score 09/11/22 0829 7     Pain Loc --      Pain Edu? --      Excl. in [place]? --    No data found.  Updated Vital Signs BP 137/87 (BP Location: Left Arm)   Pulse 96   Temp 98.4 F (36.9 C) (Oral)   Resp 18   SpO2 98%   Visual Acuity Right Eye Distance:   Left Eye Distance:  Bilateral Distance:    Right Eye Near:   Left Eye Near:    Bilateral Near:     Physical Exam Vitals and nursing note reviewed.  Constitutional:      Appearance: Normal appearance. She is not ill-appearing.  HENT:     Head: Atraumatic.  Eyes:     Extraocular Movements: Extraocular movements intact.     Conjunctiva/sclera: Conjunctivae normal.  Cardiovascular:     Rate and Rhythm: Normal rate and regular rhythm.     Heart sounds: Normal heart sounds.  Pulmonary:     Effort: Pulmonary effort is normal.     Breath sounds: Normal breath sounds.  Musculoskeletal:        General: Normal range of motion.     Cervical back: Normal range of motion and neck supple.  Skin:    General: Skin is warm and dry.     Comments: Overall firm with 70 fluctuant center gluteal abscess to the right superior gluteal fold.  Mildly erythematous, no induration.  No active drainage or bleeding.  Significantly tender to palpation  Neurological:     Mental Status: She is alert and oriented to person, place, and time.  Psychiatric:        Mood and Affect: Mood normal.         Thought Content: Thought content normal.        Judgment: Judgment normal.      UC Treatments / Results  Labs (all labs ordered are listed, but only abnormal results are displayed) Labs Reviewed - No data to display  EKG   Radiology No results found.  Procedures Incision and Drainage  Date/Time: 09/11/2022 9:38 AM  Performed by: Particia Nearing, PA-C Authorized by: Particia Nearing, PA-C   Consent:    Consent obtained:  Verbal   Consent given by:  Patient   Risks, benefits, and alternatives were discussed: yes     Risks discussed:  Bleeding, incomplete drainage and pain   Alternatives discussed:  Alternative treatment Universal protocol:    Procedure explained and questions answered to patient or proxy's satisfaction: yes     Relevant documents present and verified: yes     Site/side marked: yes     Patient identity confirmed:  Verbally with patient Location:    Type:  Abscess   Location: right gluteal fold. Pre-procedure details:    Skin preparation:  Chlorhexidine with alcohol Sedation:    Sedation type:  None Anesthesia:    Anesthesia method:  Local infiltration   Local anesthetic:  Lidocaine 2% WITH epi Procedure type:    Complexity:  Simple Procedure details:    Incision types:  Stab incision   Incision depth:  Dermal   Wound management:  Probed and deloculated   Drainage:  Purulent   Drainage amount:  Moderate   Wound treatment:  Wound left open   Packing materials:  None Post-procedure details:    Procedure completion:  Tolerated well, no immediate complications  (including critical care time)  Medications Ordered in UC Medications - No data to display  Initial Impression / Assessment and Plan / UC Course  I have reviewed the triage vital signs and the nursing notes.  Pertinent labs & imaging results that were available during my care of the patient were reviewed by me and considered in my medical decision making (see chart for  details).     I&D performed today with no complications, well-tolerated.  Will place patient on doxycycline, discussed warm compresses, triple antibiotic  ointment and return precautions.  Final Clinical Impressions(s) / UC Diagnoses   Final diagnoses:  Gluteal abscess   Discharge Instructions   None    ED Prescriptions     Medication Sig Dispense Auth. Provider   doxycycline (VIBRAMYCIN) 100 MG capsule Take 1 capsule (100 mg total) by mouth 2 (two) times daily. 14 capsule Particia Nearing, [place]      PDMP not reviewed this encounter.   Roosvelt Maser [place], [place] 09/11/22 [phone]

## 2023-01-07 ENCOUNTER — Ambulatory Visit
Admission: EM | Admit: 2023-01-07 | Discharge: 2023-01-07 | Disposition: A | Discharge status: Home / Self Care | Payer: Medicaid Other | Attending: Internal Medicine | Admitting: Internal Medicine

## 2023-01-07 DIAGNOSIS — Z113 Encounter for screening for infections with a predominantly sexual mode of transmission: Secondary | ICD-10-CM | POA: Diagnosis not present

## 2023-01-07 DIAGNOSIS — N898 Other specified noninflammatory disorders of vagina: Secondary | ICD-10-CM | POA: Diagnosis not present

## 2023-01-07 DIAGNOSIS — Z3202 Encounter for pregnancy test, result negative: Secondary | ICD-10-CM | POA: Insufficient documentation

## 2023-01-07 LAB — POCT URINE PREGNANCY: Preg Test, Ur: NEGATIVE

## 2023-01-07 NOTE — ED Triage Notes (Signed)
Pt presents to uc with co of vaginal itchining and burning, no discharge. Pt reports no new partners. Pt reports she is concerned for a yeast infection.

## 2023-01-07 NOTE — ED Provider Notes (Signed)
[facility]    CSN: [id number] Arrival date & time: 01/07/23  0840      History   Chief Complaint Chief Complaint  Patient presents with   Vaginal Itching    HPI Claire Horton is a 27 y.o. female.   Patient presents with vaginal itching that started a few days ago.  Also reports discomfort/burning sensation when she wipes after urinating.  Denies dysuria, urinary frequency, abdominal pain, back pain, abnormal vaginal bleeding, hematuria. patient denies vaginal discharge.  Patient is concerned for STD given that she has had 2 new sexual partners.  Although, she denies any confirmed exposure to STD.  Last menstrual cycle was approximately 3 weeks ago per patient.  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  Patient reports that the boric acid suppository typically resolves the BV if it is present but did not this time.  Therefore, she is concerned for a different type of vaginal infection.   Vaginal Itching    Past Medical History:  Diagnosis Date   Hidradenitis suppurativa     There are no problems to display for this patient.   Past Surgical History:  Procedure Laterality Date   Cyst removed from wrist Left    WISDOM TOOTH EXTRACTION      OB History   No obstetric history on file.      Home Medications    Prior to Admission medications   Medication Sig Start Date End Date Taking? Authorizing Provider  ALAYCEN 1/35 tablet Take 1 tablet by mouth daily. 02/15/20   [provider]  doxycycline (VIBRAMYCIN) 100 MG capsule Take 1 capsule (100 mg total) by mouth 2 (two) times daily. Patient not taking: Reported on 01/07/2023 09/11/22   Volney American, PA-C  mupirocin ointment (BACTROBAN) 2 % Apply 1 application. topically 2 (two) times daily. Patient not taking: Reported on 01/07/2023 05/11/22   Geryl Councilman L, PA  naproxen  (NAPROSYN) 500 MG tablet Take 1 tablet (500 mg total) by mouth 2 (two) times daily. 03/27/20   Raylene Everts, MD  naproxen (NAPROSYN) 500 MG tablet Take 1 tablet (500 mg total) by mouth 2 (two) times daily with a meal. 09/10/21   Jaynee Eagles, PA-C    Family History Family History  Problem Relation Age of Onset   Hypertension Mother     Social History Social History   Tobacco Use   Smoking status: Never   Smokeless tobacco: Never  Vaping Use   Vaping Use: Never used  Substance Use Topics   Alcohol use: Yes    Alcohol/week: 3.0 standard drinks of alcohol    Types: 3 Shots of liquor per week   Drug use: Never     Allergies   Patient has no known allergies.   Review of Systems Review of Systems Per HPI  Physical Exam Triage Vital Signs ED Triage Vitals  Enc Vitals Group     BP 01/07/23 0906 125/78     Pulse Rate 01/07/23 0906 94     Resp 01/07/23 0906 19     Temp 01/07/23 0906 98.3 F (36.8 C)     Temp Source 01/07/23 0906 Oral     SpO2 01/07/23 0906 98 %     Weight --      Height --      Head Circumference --      Peak Flow --  Pain Score 01/07/23 0905 0     Pain Loc --      Pain Edu? --      Excl. in [place]? --    No data found.  Updated Vital Signs BP 125/78   Pulse 94   Temp 98.3 F (36.8 C) (Oral)   Resp 19   LMP  (Within Weeks)   SpO2 98%   Visual Acuity Right Eye Distance:   Left Eye Distance:   Bilateral Distance:    Right Eye Near:   Left Eye Near:    Bilateral Near:     Physical Exam Constitutional:      General: She is not in acute distress.    Appearance: Normal appearance. She is not toxic-appearing or diaphoretic.  HENT:     Head: Normocephalic and atraumatic.  Eyes:     Extraocular Movements: Extraocular movements intact.     Conjunctiva/sclera: Conjunctivae normal.  Cardiovascular:     Rate and Rhythm: Normal rate and regular rhythm.     Pulses: Normal pulses.     Heart sounds: Normal heart sounds.  Pulmonary:      Effort: Pulmonary effort is normal. No respiratory distress.     Breath sounds: Normal breath sounds.  Abdominal:     General: Bowel sounds are normal. There is no distension.     Palpations: Abdomen is soft.     Tenderness: There is no abdominal tenderness.  Genitourinary:    Comments: Deferred with shared decision making.  Self swab performed. Neurological:     General: No focal deficit present.     Mental Status: She is alert and oriented to person, place, and time. Mental status is at baseline.  Psychiatric:        Mood and Affect: Mood normal.        Behavior: Behavior normal.        Thought Content: Thought content normal.        Judgment: Judgment normal.      UC Treatments / Results  Labs (all labs ordered are listed, but only abnormal results are displayed) Labs Reviewed  POCT URINE PREGNANCY  CERVICOVAGINAL ANCILLARY ONLY    EKG   Radiology No results found.  Procedures Procedures (including critical care time)  Medications Ordered in UC Medications - No data to display  Initial Impression / Assessment and Plan / UC Course  I have reviewed the triage vital signs and the nursing notes.  Pertinent labs & imaging results that were available during my care of the patient were reviewed by me and considered in my medical decision making (see chart for details).     Cervicovaginal swab pending.  Given no confirmed exposure to STD, will await results for any further treatment.  Urine pregnancy test negative.  Patient advised to follow-up if any symptoms persist or worsen.  Patient verbalized understanding and was agreeable with plan. Final Clinical Impressions(s) / UC Diagnoses   Final diagnoses:  Vaginal itching  Screening examination for venereal disease  Urine pregnancy test negative     Discharge Instructions      Your pregnancy test was negative.  Your vaginal swab is pending.  We will call if it is abnormal and treat as appropriate.  Please  follow-up if symptoms persist or worsen.  Refrain from sexual activity until test results and treatment are complete.     ED Prescriptions   None    PDMP not reviewed this encounter.   Teodora Medici,  01/07/23 [phone]

## 2023-01-07 NOTE — Discharge Instructions (Signed)
Your pregnancy test was negative.  Your vaginal swab is pending.  We will call if it is abnormal and treat as appropriate.  Please follow-up if symptoms persist or worsen.  Refrain from sexual activity until test results and treatment are complete.

## 2023-01-09 ENCOUNTER — Telehealth (HOSPITAL_COMMUNITY): Payer: Self-pay | Admitting: Emergency Medicine

## 2023-01-09 LAB — CERVICOVAGINAL ANCILLARY ONLY
Bacterial Vaginitis (gardnerella): NEGATIVE
Candida Glabrata: NEGATIVE
Candida Vaginitis: POSITIVE — AB
Chlamydia: NEGATIVE
Comment: NEGATIVE
Comment: NEGATIVE
Comment: NEGATIVE
Comment: NEGATIVE
Comment: NEGATIVE
Comment: NORMAL
Neisseria Gonorrhea: NEGATIVE
Trichomonas: NEGATIVE

## 2023-01-09 MED ORDER — FLUCONAZOLE 150 MG PO TABS: 150.0000 mg | ORAL_TABLET | Freq: Once | ORAL | 0 refills | Status: AC

## 2023-02-06 DIAGNOSIS — L732 Hidradenitis suppurativa: Secondary | ICD-10-CM | POA: Insufficient documentation

## 2024-02-07 DIAGNOSIS — E669 Obesity, unspecified: Secondary | ICD-10-CM | POA: Insufficient documentation

## 2024-02-28 ENCOUNTER — Ambulatory Visit
Admission: EM | Admit: 2024-02-28 | Discharge: 2024-02-28 | Disposition: A | Discharge status: Home / Self Care | Attending: Family Medicine | Admitting: Family Medicine

## 2024-02-28 DIAGNOSIS — L02214 Cutaneous abscess of groin: Secondary | ICD-10-CM

## 2024-02-28 MED ORDER — DOXYCYCLINE HYCLATE 100 MG PO CAPS: 100.0000 mg | ORAL_CAPSULE | Freq: Two times a day (BID) | ORAL | 0 refills | Status: AC

## 2024-02-28 MED ORDER — HYDROCODONE-ACETAMINOPHEN 5-325 MG PO TABS: 1.0000 | ORAL_TABLET | Freq: Four times a day (QID) | ORAL | 0 refills | Status: AC | PRN

## 2024-02-28 NOTE — Discharge Instructions (Signed)
 Be aware, you have been prescribed pain medications that may cause drowsiness. While taking this medication, do not take any other medications containing acetaminophen (Tylenol). Do not combine with alcohol or recreational drugs. Please do not drive, operate heavy machinery, or take part in activities that require making important decisions while on this medication as your judgement may be clouded.

## 2024-02-28 NOTE — ED Triage Notes (Signed)
"  I have a boil that is getting big and won't pop on it own". Boil is in left (inguinal area). No fever. "I have had these before".

## 2024-02-28 NOTE — ED Provider Notes (Signed)
 [facility]   [id number] 02/28/24 Arrival Time: 1804  ASSESSMENT & PLAN:  1. Abscess of groin, left     Incision and Drainage Procedure Note  Anesthesia: 1% plain lidocaine  Procedure Details  The procedure, risks and complications have been discussed in detail (including, but not limited to pain and bleeding) with the patient.  The skin induration was prepped and draped in the usual fashion. After adequate local anesthesia, I&D with a #11 blade was performed on the left groin with copious, bloody, purulent drainage.  EBL: minimal Drains: none Packing: 1" iodoform Condition: Tolerated procedure well Complications: none.  Meds ordered this encounter  Medications   doxycycline (VIBRAMYCIN) 100 MG capsule    Sig: Take 1 capsule (100 mg total) by mouth 2 (two) times daily.    Dispense:  14 capsule    Refill:  0   HYDROcodone-acetaminophen (NORCO/VICODIN) 5-325 MG tablet    Sig: Take 1 tablet by mouth every 6 (six) hours as needed for moderate pain (pain score 4-6) or severe pain (pain score 7-10).    Dispense:  8 tablet    Refill:  0    Wound care instructions discussed and given in written format. To return in 48 hours for wound check.  Finish all antibiotics. OTC analgesics as needed.  Reviewed expectations re: course of current medical issues. Questions answered. Outlined signs and symptoms indicating need for more acute intervention. Patient verbalized understanding. After Visit Summary given.   SUBJECTIVE:  Claire Horton is a 28 y.o. female who presents with a possible infection of her LEFT groin. Onset gradual, approximately 1 week ago without active drainage and without active bleeding. Symptoms have progressed to a point and plateaued since beginning. Fever: absent. OTC/home treatment: none.   OBJECTIVE:  Vitals:   02/28/24 1854 02/28/24 1857  BP:  127/88  Pulse:  (!) 103  Resp:  18  Temp:  98.8 F (37.1 C)  TempSrc:  Oral  SpO2:  97%   Weight: 93.9 kg   Height: 5\' 7"  (1.702 m)      General appearance: alert; no distress LEFT groin: approx 3x4 cm induration; tender to touch; no active drainage or bleeding Psychological: alert and cooperative; normal mood and affect  No Known Allergies  Past Medical History:  Diagnosis Date   Hidradenitis suppurativa    Social History   Socioeconomic History   Marital status: Single    Spouse name: Not on file   Number of children: Not on file   Years of education: Not on file   Highest education level: Not on file  Occupational History   Not on file  Tobacco Use   Smoking status: Never   Smokeless tobacco: Never  Vaping Use   Vaping status: Never Used  Substance and Sexual Activity   Alcohol use: Yes    Alcohol/week: 3.0 standard drinks of alcohol    Types: 3 Shots of liquor per week    Comment: Occassionally.   Drug use: Never   Sexual activity: Not Currently  Other Topics Concern   Not on file  Social History Narrative   Not on file   Social Drivers of Health   Financial Resource Strain: Low Risk  (02/07/2024)   [facility]   Overall Financial Resource Strain (CARDIA)    Difficulty of Paying Living Expenses: Not hard at all  Food Insecurity: No Food Insecurity (02/07/2024)   Received [facility]   Hunger Vital Sign    Worried About  Running Out of Food in the Last Year: Never true    Ran Out of Food in the Last Year: Never true  Transportation Needs: No Transportation Needs (02/07/2024)   Received from [facility] - Transportation    Lack of Transportation (Medical): No    Lack of Transportation (Non-Medical): No  Physical Activity: Insufficiently Active (02/07/2024)   Received from [facility]   Exercise Vital Sign    Days of Exercise per Week: 1 day    Minutes of Exercise per Session: 20 min  Stress: No Stress Concern Present (02/07/2024)   Received from [facility] -  Occupational Stress Questionnaire    Feeling of Stress : Not at all  Social Connections: Socially Integrated (02/07/2024)   Received from [facility]   Social Network    How would you rate your social network (family, work, friends)?: Good participation with social networks   Family History  Problem Relation Age of Onset   Hypertension Mother    Past Surgical History:  Procedure Laterality Date   Cyst removed from wrist Left    WISDOM TOOTH EXTRACTION              Mardella Layman, MD 02/28/24 2020
